# Patient Record
Sex: Female | Born: 1965 | Race: White | Hispanic: No | Marital: Married | Smoking: Never smoker
Health system: Southern US, Community
[De-identification: ages and names within clinical notes are randomized; demographics above are authoritative.]

## PROBLEM LIST (undated history)

## (undated) DIAGNOSIS — M797 Fibromyalgia: Secondary | ICD-10-CM

## (undated) DIAGNOSIS — G43909 Migraine, unspecified, not intractable, without status migrainosus: Secondary | ICD-10-CM

## (undated) DIAGNOSIS — F4310 Post-traumatic stress disorder, unspecified: Secondary | ICD-10-CM

---

## 1997-04-01 ENCOUNTER — Other Ambulatory Visit: Admission: RE | Admit: 1997-04-01 | Discharge: 1997-04-01 | Payer: Self-pay | Admitting: *Deleted

## 1997-07-29 ENCOUNTER — Inpatient Hospital Stay (HOSPITAL_COMMUNITY): Admission: AD | Admit: 1997-07-29 | Discharge: 1997-07-29 | Payer: Self-pay | Admitting: Obstetrics and Gynecology

## 1997-09-08 ENCOUNTER — Ambulatory Visit (HOSPITAL_COMMUNITY): Admission: RE | Admit: 1997-09-08 | Discharge: 1997-09-08 | Payer: Self-pay | Admitting: Obstetrics and Gynecology

## 1997-10-18 ENCOUNTER — Inpatient Hospital Stay (HOSPITAL_COMMUNITY): Admission: AD | Admit: 1997-10-18 | Discharge: 1997-10-18 | Payer: Self-pay | Admitting: Obstetrics and Gynecology

## 1997-10-21 ENCOUNTER — Inpatient Hospital Stay (HOSPITAL_COMMUNITY): Admission: AD | Admit: 1997-10-21 | Discharge: 1997-10-24 | Payer: Self-pay | Admitting: *Deleted

## 1997-12-02 ENCOUNTER — Other Ambulatory Visit: Admission: RE | Admit: 1997-12-02 | Discharge: 1997-12-02 | Payer: Self-pay | Admitting: *Deleted

## 1999-01-12 ENCOUNTER — Other Ambulatory Visit: Admission: RE | Admit: 1999-01-12 | Discharge: 1999-01-12 | Payer: Self-pay | Admitting: *Deleted

## 2003-12-01 ENCOUNTER — Other Ambulatory Visit: Admission: RE | Admit: 2003-12-01 | Discharge: 2003-12-01 | Payer: Self-pay | Admitting: *Deleted

## 2004-12-11 ENCOUNTER — Other Ambulatory Visit: Admission: RE | Admit: 2004-12-11 | Discharge: 2004-12-11 | Payer: Self-pay | Admitting: *Deleted

## 2006-01-16 ENCOUNTER — Other Ambulatory Visit: Admission: RE | Admit: 2006-01-16 | Discharge: 2006-01-16 | Payer: Self-pay | Admitting: *Deleted

## 2006-08-18 ENCOUNTER — Ambulatory Visit (HOSPITAL_BASED_OUTPATIENT_CLINIC_OR_DEPARTMENT_OTHER): Admission: RE | Admit: 2006-08-18 | Discharge: 2006-08-18 | Payer: Self-pay | Admitting: Rheumatology

## 2006-08-25 ENCOUNTER — Ambulatory Visit: Payer: Self-pay | Admitting: Internal Medicine

## 2007-01-23 ENCOUNTER — Other Ambulatory Visit: Admission: RE | Admit: 2007-01-23 | Discharge: 2007-01-23 | Payer: Self-pay | Admitting: Family Medicine

## 2008-01-27 ENCOUNTER — Other Ambulatory Visit: Admission: RE | Admit: 2008-01-27 | Discharge: 2008-01-27 | Payer: Self-pay | Admitting: Family Medicine

## 2009-01-27 ENCOUNTER — Other Ambulatory Visit: Admission: RE | Admit: 2009-01-27 | Discharge: 2009-01-27 | Payer: Self-pay | Admitting: Family Medicine

## 2009-02-23 ENCOUNTER — Encounter: Admission: RE | Admit: 2009-02-23 | Discharge: 2009-02-23 | Payer: Self-pay | Admitting: Family Medicine

## 2009-05-31 ENCOUNTER — Emergency Department (HOSPITAL_COMMUNITY): Admission: EM | Admit: 2009-05-31 | Discharge: 2009-05-31 | Payer: Self-pay | Admitting: Emergency Medicine

## 2009-06-30 ENCOUNTER — Encounter: Admission: RE | Admit: 2009-06-30 | Discharge: 2009-06-30 | Payer: Self-pay | Admitting: Family Medicine

## 2009-07-12 ENCOUNTER — Encounter: Admission: RE | Admit: 2009-07-12 | Discharge: 2009-07-12 | Payer: Self-pay | Admitting: Family Medicine

## 2010-02-04 ENCOUNTER — Emergency Department (INDEPENDENT_AMBULATORY_CARE_PROVIDER_SITE_OTHER): Payer: 59

## 2010-02-04 ENCOUNTER — Emergency Department (HOSPITAL_BASED_OUTPATIENT_CLINIC_OR_DEPARTMENT_OTHER)
Admission: EM | Admit: 2010-02-04 | Discharge: 2010-02-04 | Disposition: A | Discharge status: Home / Self Care | Payer: 59 | Attending: Emergency Medicine | Admitting: Emergency Medicine

## 2010-02-04 ENCOUNTER — Inpatient Hospital Stay (HOSPITAL_BASED_OUTPATIENT_CLINIC_OR_DEPARTMENT_OTHER)
Admission: EM | Admit: 2010-02-04 | Discharge: 2010-02-04 | Disposition: A | Discharge status: Home / Self Care | Payer: Self-pay | Attending: Emergency Medicine | Admitting: Emergency Medicine

## 2010-02-04 DIAGNOSIS — N390 Urinary tract infection, site not specified: Secondary | ICD-10-CM | POA: Insufficient documentation

## 2010-02-04 DIAGNOSIS — F411 Generalized anxiety disorder: Secondary | ICD-10-CM | POA: Insufficient documentation

## 2010-02-04 DIAGNOSIS — R05 Cough: Secondary | ICD-10-CM | POA: Insufficient documentation

## 2010-02-04 DIAGNOSIS — R42 Dizziness and giddiness: Secondary | ICD-10-CM | POA: Insufficient documentation

## 2010-02-04 DIAGNOSIS — IMO0001 Reserved for inherently not codable concepts without codable children: Secondary | ICD-10-CM | POA: Insufficient documentation

## 2010-02-04 DIAGNOSIS — R5381 Other malaise: Secondary | ICD-10-CM | POA: Insufficient documentation

## 2010-02-04 DIAGNOSIS — R059 Cough, unspecified: Secondary | ICD-10-CM | POA: Insufficient documentation

## 2010-02-04 LAB — CBC
MCH: 31.9 pg (ref 26.0–34.0)
Platelets: 262 10*3/uL (ref 150–400)
RBC: 4.08 MIL/uL (ref 3.87–5.11)
RDW: 13.3 % (ref 11.5–15.5)
WBC: 6.1 10*3/uL (ref 4.0–10.5)

## 2010-02-04 LAB — DIFFERENTIAL
Basophils Absolute: 0.1 10*3/uL (ref 0.0–0.1)
Eosinophils Relative: 1 % (ref 0–5)
Monocytes Relative: 7 % (ref 3–12)
Neutro Abs: 3.1 10*3/uL (ref 1.7–7.7)

## 2010-02-04 LAB — URINE MICROSCOPIC-ADD ON

## 2010-02-04 LAB — URINALYSIS, ROUTINE W REFLEX MICROSCOPIC
Bilirubin Urine: NEGATIVE
Protein, ur: NEGATIVE mg/dL
Specific Gravity, Urine: 1.022 (ref 1.005–1.030)
pH: 6.5 (ref 5.0–8.0)

## 2010-02-04 LAB — BASIC METABOLIC PANEL
CO2: 23 mEq/L (ref 19–32)
Chloride: 109 mEq/L (ref 96–112)
Creatinine, Ser: 0.9 mg/dL (ref 0.4–1.2)
Potassium: 4 mEq/L (ref 3.5–5.1)
Sodium: 143 mEq/L (ref 135–145)

## 2010-02-04 LAB — PREGNANCY, URINE: Preg Test, Ur: NEGATIVE

## 2010-02-06 LAB — POCT CARDIAC MARKERS
CKMB, poc: <1 ng/mL — ABNORMAL LOW (ref 1.0–8.0)
Troponin i, poc: <0.05 ng/mL (ref 0.00–0.09)

## 2010-02-09 ENCOUNTER — Other Ambulatory Visit: Payer: Self-pay | Admitting: Family Medicine

## 2010-02-09 ENCOUNTER — Other Ambulatory Visit (HOSPITAL_COMMUNITY)
Admission: RE | Admit: 2010-02-09 | Discharge: 2010-02-09 | Disposition: A | Discharge status: Home / Self Care | Payer: 59 | Source: Ambulatory Visit | Attending: Family Medicine | Admitting: Family Medicine

## 2010-02-09 DIAGNOSIS — Z113 Encounter for screening for infections with a predominantly sexual mode of transmission: Secondary | ICD-10-CM | POA: Insufficient documentation

## 2010-02-09 DIAGNOSIS — Z124 Encounter for screening for malignant neoplasm of cervix: Secondary | ICD-10-CM | POA: Insufficient documentation

## 2010-05-16 NOTE — Procedures (Signed)
NAME:  Desiree Owens, Desiree Owens NO.:  [id number]   MEDICAL RECORD NO.:  [id number]          PATIENT TYPE:  OUT   LOCATION:  SLEEP CENTER                 FACILITY:  [facility]   PHYSICIAN:  Clinton D. Maple Hudson, MD, FCCP, FACPDATE OF BIRTH:  10/09/64   DATE OF STUDY:  08/18/2006                            NOCTURNAL POLYSOMNOGRAM   REFERRING PHYSICIAN:   REFERRING PHYSICIAN:  Shail B. Deveshwar MD   INDICATION FOR STUDY:  Hypersomnia with sleep apnea.   EPWORTH SLEEPINESS SCORE:  12/24.  BMI 24.  Weight 154 pounds.   HOME MEDICATIONS:  Listed and reviewed.   Diagnostic NPSG protocol was requested.   SLEEP ARCHITECTURE:  Total sleep time 358 minutes with sleep efficiency  83%.  Stage I was 3%, stage II 72%, stage III 7%, REM 18% of total sleep  time.  Sleep latency 41 minutes, REM latency 103 minutes, awake after  sleep onset 31 minutes, arousal index 21.6.  No bedtime medication was  reported.   RESPIRATORY DATA:  Apnea/hypopnea index (AHI/RDI) 0.3 obstructive events  per hour which is within normal limits (normal range 0-5 per hour).  There was 1 central apnea and 1 obstructive apnea with both events  recorded while sleeping supine.   OXYGEN DATA:  Very loud snoring with oxygen desaturation to a nadir of  91%.  Mean oxygen saturation through the study was 97% on room air.   CARDIAC DATA:  Normal sinus rhythm.   MOVEMENT-PARASOMNIA:  No significant limb jerk.  No bathroom visits.   IMPRESSIONS-RECOMMENDATIONS:  1. Unremarkable sleep architecture for sleep center environment with      mildly reduced time in REM, mild increase in EEG arousal (21.6 per      hour) is nonspecific.  2. No significant sleep disordered breathing events, AHI 0.3 per hours      but with loud snoring.  Oxygen      desaturation to a nadir of 96% which is normal.  3. No specific therapy is indicated from this study.      Clinton D. Maple Hudson, MD, [facility], FACP  Diplomate, Biomedical engineer of Sleep  Medicine  Electronically Signed     CDY/MEDQ  D:  08/24/2006 12:39:05  T:  08/25/2006 14:35:58  Job:  [id number]

## 2012-07-17 ENCOUNTER — Encounter: Payer: Self-pay | Admitting: Rheumatology

## 2012-08-01 ENCOUNTER — Encounter: Payer: Self-pay | Admitting: Rheumatology

## 2013-03-09 ENCOUNTER — Emergency Department: Payer: Self-pay | Admitting: Emergency Medicine

## 2013-03-09 LAB — LIPASE, BLOOD: Lipase: 209 U/L (ref 73–393)

## 2013-03-12 LAB — CLOSTRIDIUM DIFFICILE(ARMC)

## 2013-03-31 ENCOUNTER — Other Ambulatory Visit (HOSPITAL_COMMUNITY)
Admission: RE | Admit: 2013-03-31 | Discharge: 2013-03-31 | Disposition: A | Discharge status: Home / Self Care | Payer: 59 | Source: Ambulatory Visit | Attending: Family Medicine | Admitting: Family Medicine

## 2013-03-31 ENCOUNTER — Other Ambulatory Visit: Payer: Self-pay | Admitting: Family Medicine

## 2013-03-31 DIAGNOSIS — Z124 Encounter for screening for malignant neoplasm of cervix: Secondary | ICD-10-CM | POA: Insufficient documentation

## 2013-03-31 DIAGNOSIS — R87810 Cervical high risk human papillomavirus (HPV) DNA test positive: Secondary | ICD-10-CM | POA: Insufficient documentation

## 2013-03-31 DIAGNOSIS — Z1151 Encounter for screening for human papillomavirus (HPV): Secondary | ICD-10-CM | POA: Insufficient documentation

## 2013-08-06 ENCOUNTER — Ambulatory Visit: Payer: Self-pay | Admitting: Otolaryngology

## 2014-03-08 ENCOUNTER — Emergency Department: Payer: Self-pay | Admitting: Student

## 2017-10-18 ENCOUNTER — Other Ambulatory Visit: Payer: Self-pay

## 2017-10-18 ENCOUNTER — Emergency Department
Admission: EM | Admit: 2017-10-18 | Discharge: 2017-10-18 | Disposition: A | Discharge status: Home / Self Care | Payer: BLUE CROSS/BLUE SHIELD | Attending: Emergency Medicine | Admitting: Emergency Medicine

## 2017-10-18 ENCOUNTER — Encounter: Payer: Self-pay | Admitting: Emergency Medicine

## 2017-10-18 ENCOUNTER — Emergency Department: Payer: BLUE CROSS/BLUE SHIELD

## 2017-10-18 DIAGNOSIS — G43909 Migraine, unspecified, not intractable, without status migrainosus: Secondary | ICD-10-CM | POA: Diagnosis not present

## 2017-10-18 DIAGNOSIS — R51 Headache: Secondary | ICD-10-CM | POA: Diagnosis present

## 2017-10-18 HISTORY — DX: Fibromyalgia: M79.7

## 2017-10-18 HISTORY — DX: Post-traumatic stress disorder, unspecified: F43.10

## 2017-10-18 HISTORY — DX: Migraine, unspecified, not intractable, without status migrainosus: G43.909

## 2017-10-18 MED ORDER — METOCLOPRAMIDE HCL 5 MG/ML IJ SOLN
20.0000 mg | Freq: Once | INTRAVENOUS | Status: AC
  Administered 2017-10-18: 20 mg via INTRAVENOUS
  Filled 2017-10-18: qty 4

## 2017-10-18 MED ORDER — DIPHENHYDRAMINE HCL 50 MG/ML IJ SOLN
25.0000 mg | Freq: Once | INTRAMUSCULAR | Status: AC
  Administered 2017-10-18: 25 mg via INTRAVENOUS
  Filled 2017-10-18: qty 1

## 2017-10-18 MED ORDER — SODIUM CHLORIDE 0.9 % IV SOLN
1000.0000 mL | Freq: Once | INTRAVENOUS | Status: AC
  Administered 2017-10-18: 1000 mL via INTRAVENOUS

## 2017-10-18 MED ORDER — KETOROLAC TROMETHAMINE 30 MG/ML IJ SOLN
30.0000 mg | Freq: Once | INTRAMUSCULAR | Status: AC
  Administered 2017-10-18: 30 mg via INTRAVENOUS
  Filled 2017-10-18: qty 1

## 2017-10-18 NOTE — ED Provider Notes (Signed)
[facility] Emergency Department Provider Note   ____________________________________________    I have reviewed the triage vital signs and the nursing notes.   HISTORY  Chief Complaint Migraine     HPI Iasha Mccalister is a 52 y.o. female who presents with complaints of a headache.  Patient reports she has frequent migraine headaches and this morning around 11 AM she developed what started as a typical migraine headache with photophobia and sensitivity to sound as well.  However she reports later in the day she developed a sharp pain just above her left eye which is unusual for her.  Denies numbness or tingling.  Mild nausea no vomiting.  No change in vision.  No fevers.  Has tried over-the-counter medications without improvement   Past Medical History:  Diagnosis Date  . Fibromyalgia   . Migraine   . PTSD (post-traumatic stress disorder)     There are no active problems to display for this patient.   History reviewed. No pertinent surgical history.  Prior to Admission medications   Not on File     Allergies Tetracycline; Nitrofurantoin; and Erythromycin  No family history on file.  Social History Social History   Tobacco Use  . Smoking status: Never Smoker  . Smokeless tobacco: Never Used  Substance Use Topics  . Alcohol use: Not on file  . Drug use: Not on file    Review of Systems  Constitutional: No fever/chills Eyes: No visual changes.  ENT: No neck pain Cardiovascular: Denies chest pain. Respiratory: Denies shortness of breath. Gastrointestinal: No abdominal pain.  No nausea, no vomiting.   Genitourinary: Negative for dysuria. Musculoskeletal: Negative for back pain. Skin: Negative for rash. Neurological: As above   ____________________________________________   PHYSICAL EXAM:  VITAL SIGNS: ED Triage Vitals  Enc Vitals Group     BP 10/18/17 1558 139/69     Pulse Rate 10/18/17 1558 72     Resp 10/18/17 1558  18     Temp 10/18/17 1558 98.7 F (37.1 C)     Temp Source 10/18/17 1558 Oral     SpO2 10/18/17 1558 94 %     Weight 10/18/17 1559 81.6 kg (180 lb)     Height 10/18/17 1559 1.448 m (4\' 9" )     Head Circumference --      Peak Flow --      Pain Score 10/18/17 1559 7     Pain Loc --      Pain Edu? --      Excl. in GC? --     Constitutional: Alert and oriented.  Eyes: Conjunctivae are normal.   Nose: No congestion/rhinnorhea. Mouth/Throat: Mucous membranes are moist.    Cardiovascular: Normal rate, regular rhythm. Grossly normal heart sounds.  Good peripheral circulation. Respiratory: Normal respiratory effort.  No retractions. Lungs CTAB. Gastrointestinal: Soft and nontender. No distention.  No CVA tenderness. Genitourinary: deferred Musculoskeletal:   Warm and well perfused Neurologic:  Normal speech and language. No gross focal neurologic deficits are appreciated.  Skin:  Skin is warm, dry and intact. No rash noted. Psychiatric: Mood and affect are normal. Speech and behavior are normal.  ____________________________________________   LABS (all labs ordered are listed, but only abnormal results are displayed)  Labs Reviewed - No data to display ____________________________________________  EKG  None ____________________________________________  RADIOLOGY CT head unremarkable ____________________________________________   PROCEDURES  Procedure(s) performed: No  Procedures   Critical Care performed: No ____________________________________________   INITIAL IMPRESSION / ASSESSMENT AND  PLAN / ED COURSE  Pertinent labs & imaging results that were available during my care of the patient were reviewed by me and considered in my medical decision making (see chart for details).  Patient presents with likely migraine headache although it is worse than typical.  Given this we will obtain imaging.  We will treat with IV Toradol, IV fluids, IV Benadryl, IV Reglan and  reevaluate  Imaging is reassuring  After treatment patient reports she feels much better.  She is now sitting up and no longer has the blanket over her head.  Tolerating p.o.'s, appropriate for discharge at this time with outpatient follow-up.    ____________________________________________   FINAL CLINICAL IMPRESSION(S) / ED DIAGNOSES  Final diagnoses:  Migraine without status migrainosus, not intractable, unspecified migraine type        Note:  This document was prepared using Dragon voice recognition software and may include unintentional dictation errors.    Jene Every, MD 10/18/17 2044

## 2017-10-18 NOTE — ED Notes (Addendum)
Pt to the er for a migraine. Pt takes gabapentin and atenolol daily and has not missed any medications. Pt used to take maxalt for pain when she had a headache but pt has not had a RX for several years. Pt says pain has improved some while laying in the dark. Pain is sharp to the left temple.

## 2017-10-18 NOTE — ED Triage Notes (Signed)
Pt arrived via EMS from work with reports of headache that started today at work. Pt has hx of migraines states she takes preventive medication for migraines atenolol and gapapentin. States this pain is not usual migraine pain, and is localized around the left temple.   Pt reports nausea, vomiting and photosensitivity with the headache. Pt using ice pack to help with the pain.   Denies any falls or head trauma.

## 2017-10-18 NOTE — ED Notes (Signed)
Pt given water for PO challenge. Dr Cyril Loosen at bedside.

## 2017-10-18 NOTE — ED Triage Notes (Signed)
First Nurse Note:  Arrives via [facility] for ED evaluation of headache.  States pain is not typical migraine pain.  Photophobia and sensitivity to sounds reported.

## 2019-03-19 ENCOUNTER — Ambulatory Visit: Payer: Self-pay | Attending: Internal Medicine

## 2019-09-05 IMAGING — CT CT HEAD W/O CM
3 series · 15 of 47 positions shown, 18 images · non-contrast
Comparison: None.

CLINICAL DATA: Acute headache, severe, worst of life.

History of migraines
EXAM:
CT HEAD WITHOUT CONTRAST
TECHNIQUE: Contiguous axial images were obtained from the base of the skull
through the vertex without intravenous contrast.

[Series 2: head wo · axial · 0.41mm/px · z∈[+524,+649]mm · 9 of 31 slices shown, 12 images]
[im 3/31  brain]
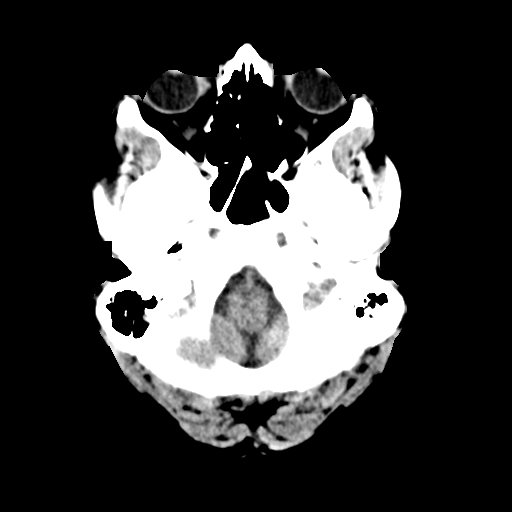
[im 3/31  bone]
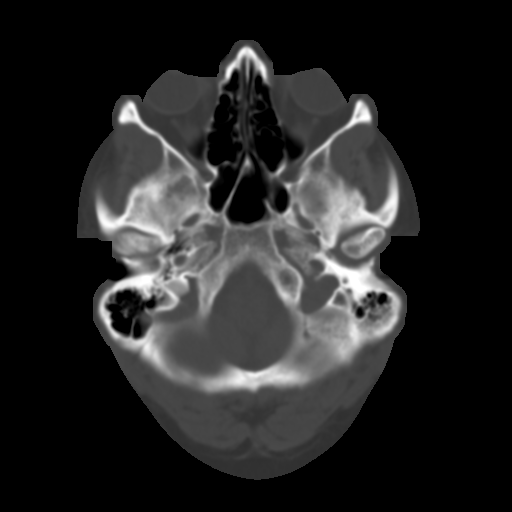
[im 6/31  brain]
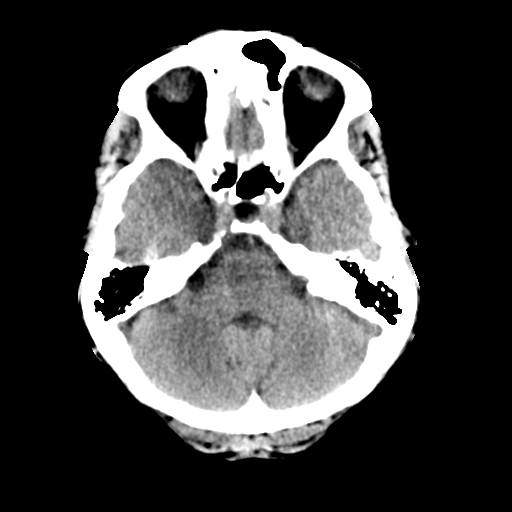
[im 9/31  brain]
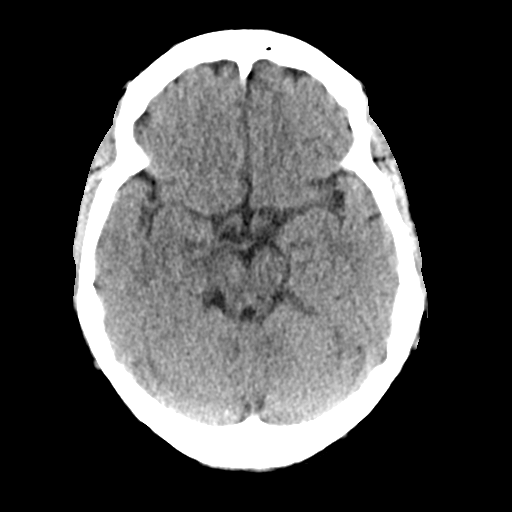
[im 12/31  brain]
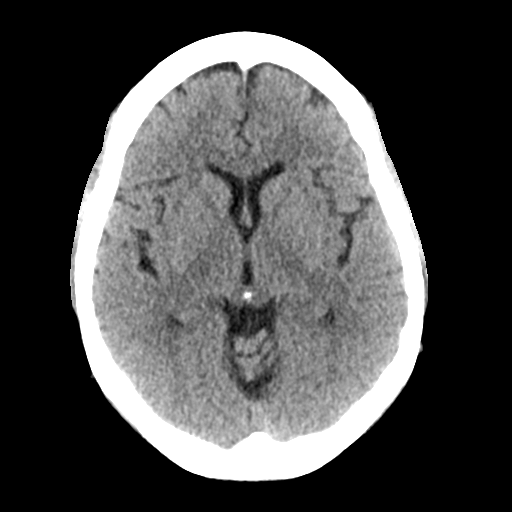
[im 16/31  brain]
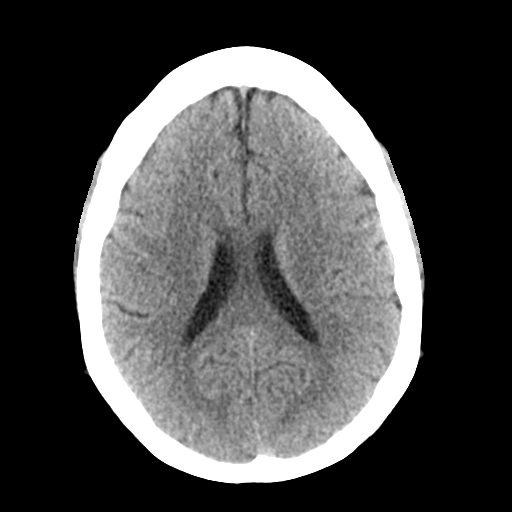
[im 16/31  bone]
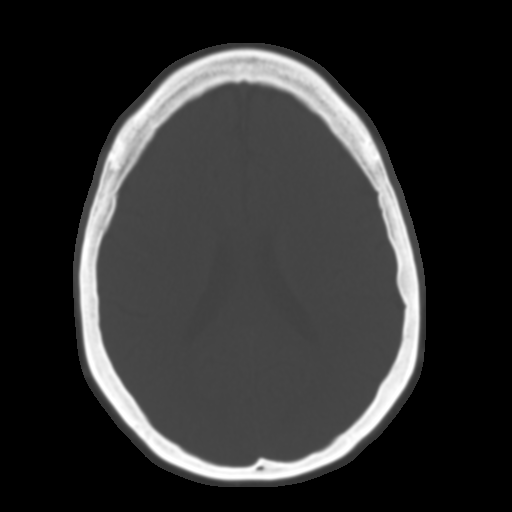
[im 19/31  brain]
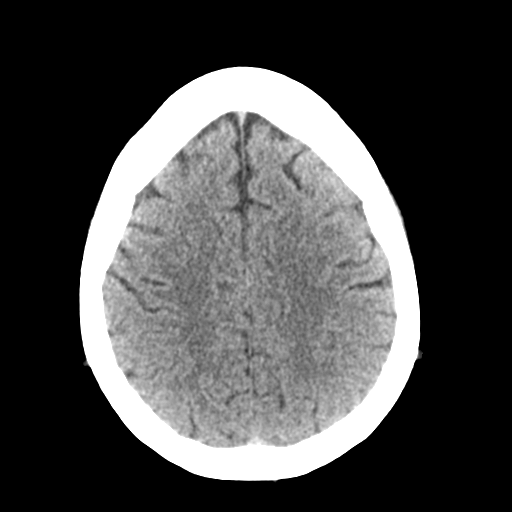
[im 22/31  brain]
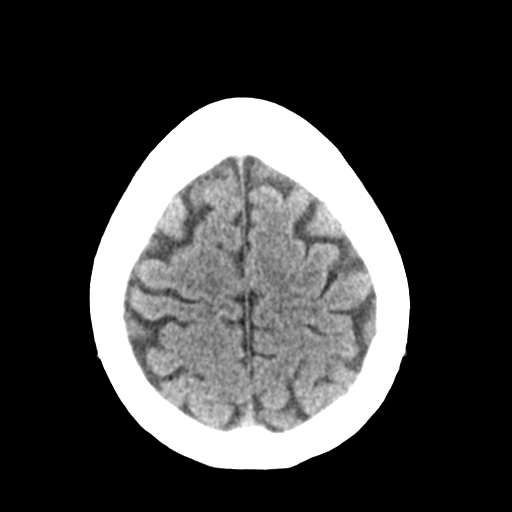
[im 25/31  brain]
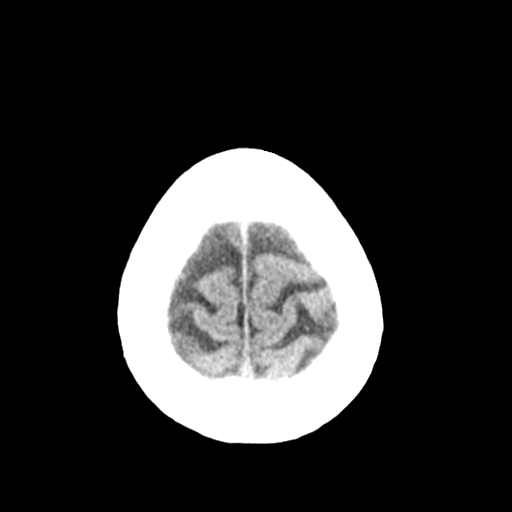
[im 28/31  brain]
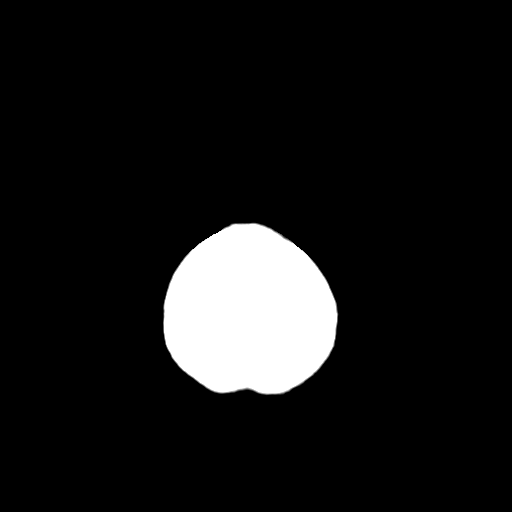
[im 28/31  bone]
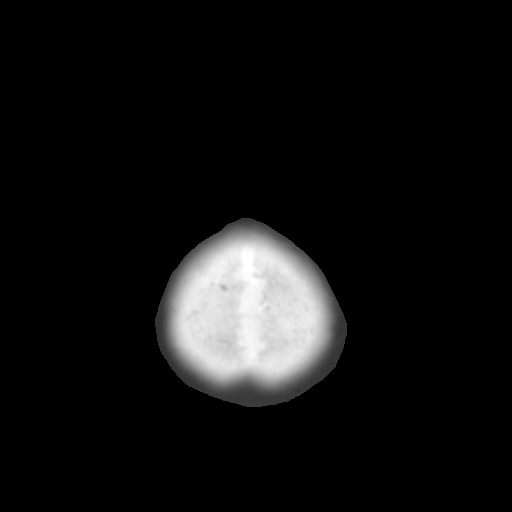

[Series 4: coronal soft tissue · coronal · 0.31mm/px · 3 of 64 slices shown]
[im 22/64  brain]
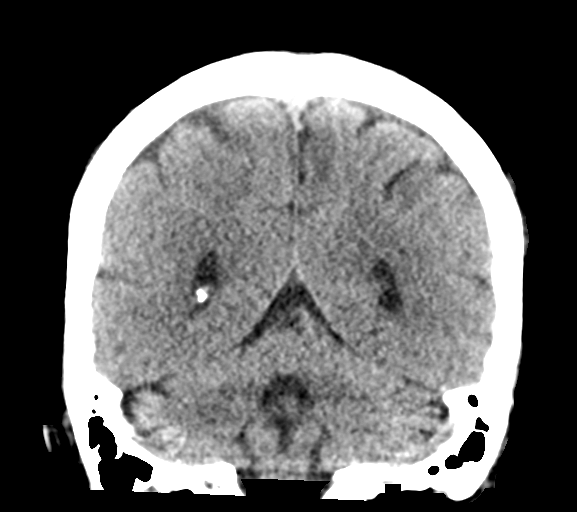
[im 29/64  brain]
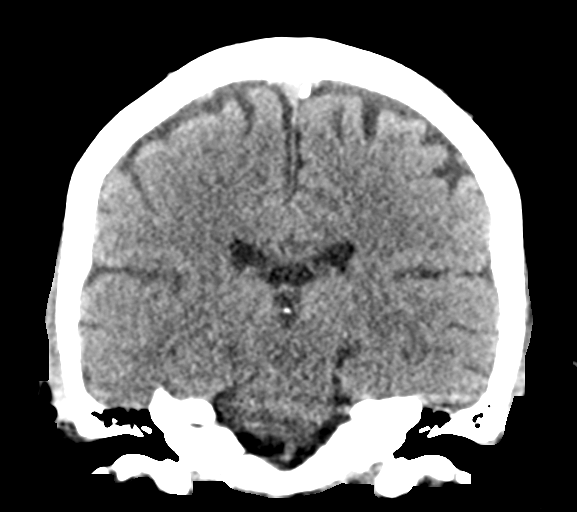
[im 36/64  brain]
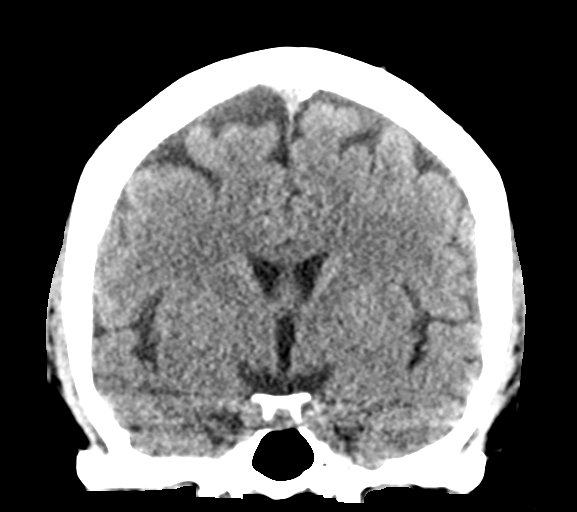

[Series 5: sagittal soft tissue · sagittal · 0.31mm/px · 3 of 52 slices shown]
[im 18/52  brain]
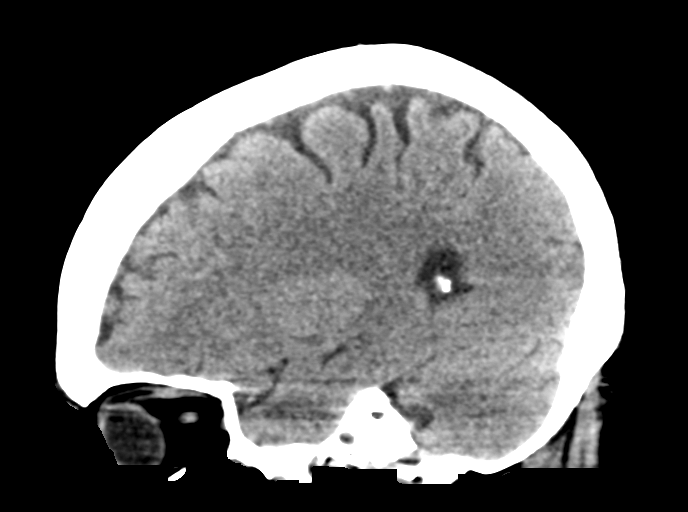
[im 26/52  brain]
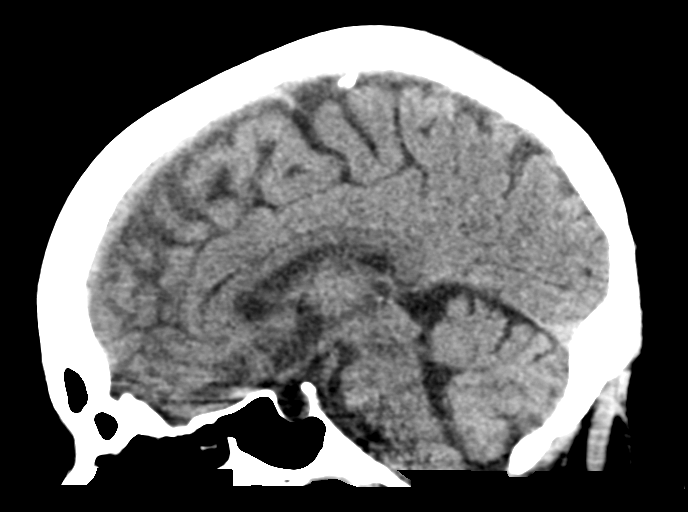
[im 35/52  brain]
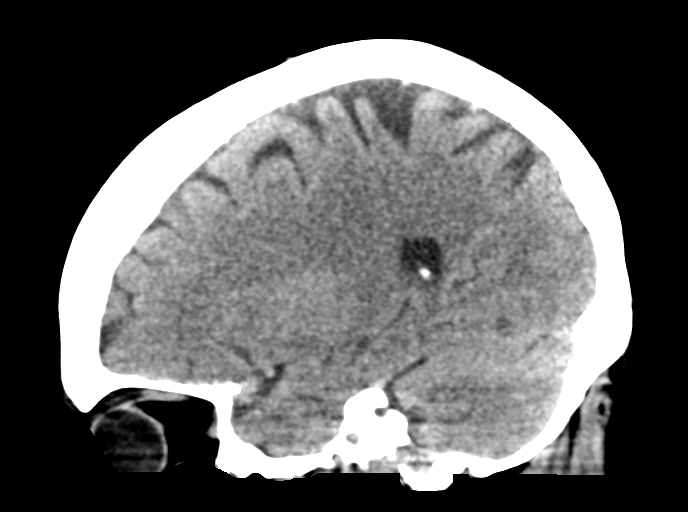

[15 of 47 positions shown; findings below may reference images not displayed]

FINDINGS: Brain: No evidence of acute infarction, hemorrhage, hydrocephalus,
extra-axial collection or mass lesion/mass effect.

Vascular: No hyperdense vessel or unexpected calcification.

Skull: Normal. Negative for fracture or focal lesion.

Sinuses/Orbits: No acute finding.
IMPRESSION: Negative head CT.
